# Patient Record
Sex: Female | Born: 1990 | Hispanic: No | Marital: Married | Smoking: Never smoker
Health system: Southern US, Community
[De-identification: ages and names within clinical notes are randomized; demographics above are authoritative.]

## PROBLEM LIST (undated history)

## (undated) DIAGNOSIS — O403XX0 Polyhydramnios, third trimester, not applicable or unspecified: Secondary | ICD-10-CM

## (undated) DIAGNOSIS — O409XX0 Polyhydramnios, unspecified trimester, not applicable or unspecified: Secondary | ICD-10-CM

## (undated) DIAGNOSIS — L680 Hirsutism: Secondary | ICD-10-CM

## (undated) DIAGNOSIS — O30011 Twin pregnancy, monochorionic/monoamniotic, first trimester: Secondary | ICD-10-CM

## (undated) HISTORY — DX: Hirsutism: L68.0

## (undated) HISTORY — DX: Polyhydramnios, unspecified trimester, not applicable or unspecified: O40.9XX0

## (undated) HISTORY — PX: DILATION AND CURETTAGE OF UTERUS: SHX78

---

## 2014-07-17 ENCOUNTER — Other Ambulatory Visit (HOSPITAL_COMMUNITY): Payer: Self-pay | Admitting: Obstetrics & Gynecology

## 2014-07-17 DIAGNOSIS — O30011 Twin pregnancy, monochorionic/monoamniotic, first trimester: Secondary | ICD-10-CM

## 2014-07-24 ENCOUNTER — Ambulatory Visit (HOSPITAL_COMMUNITY)
Admission: RE | Admit: 2014-07-24 | Discharge: 2014-07-24 | Disposition: A | Discharge status: Home / Self Care | Payer: No Typology Code available for payment source | Source: Ambulatory Visit | Attending: Obstetrics & Gynecology | Admitting: Obstetrics & Gynecology

## 2014-07-24 ENCOUNTER — Other Ambulatory Visit (HOSPITAL_COMMUNITY): Payer: Self-pay | Admitting: Obstetrics & Gynecology

## 2014-07-24 ENCOUNTER — Encounter (HOSPITAL_COMMUNITY): Payer: Self-pay

## 2014-07-24 DIAGNOSIS — Z3A11 11 weeks gestation of pregnancy: Secondary | ICD-10-CM | POA: Diagnosis not present

## 2014-07-24 DIAGNOSIS — O30011 Twin pregnancy, monochorionic/monoamniotic, first trimester: Secondary | ICD-10-CM | POA: Diagnosis not present

## 2014-07-24 HISTORY — DX: Twin pregnancy, monochorionic/monoamniotic, first trimester: O30.011

## 2014-07-24 NOTE — Progress Notes (Signed)
MATERNAL FETAL MEDICINE CONSULT  Patient Name: Sierra Choi Medical Record Number:  [id number] Date of Birth: October 01, 1990 Requesting Physician Name:  Robley FriesVaishali R Mody, MD Date of Service: 07/24/2014  Chief Complaint Monochorionic monoamniotic twins  History of Present Illness Sierra Choi was seen today secondary to monochorionic monoamniotic twins at the request of Robley FriesVaishali R Mody, MD.  The patient is a 23 y.o. G1P0000,at 4619w0d with an EDD of 02/12/2015, by Last Menstrual Period dating method.  Ms. Davia was discovered to have mono/mono twins on an ultrasound performed in Dr. Camillia HerterMody's office.  That was [facility]'s [facility]  The twins are not conjoined, but there is cord entanglement.  The CRL's are consistent with her LMP.  She has no other issues at this time.  Review of Systems Pertinent items are noted in HPI.  Patient History OB History  Gravida Para Term Preterm AB SAB TAB Ectopic Multiple Living  1 0 0 0 0 0 0 0 0 0     # Outcome Date GA Lbr Len/2nd Weight Sex Delivery Anes PTL Lv  1 Current               Past Medical History  Diagnosis Date  . Monoamniotic and monochorionic twin gestation in first trimester     Past Surgical History None  History   Social History  . Marital Status: Married    Spouse Name: N/A    Number of Children: N/A  . Years of Education: N/A   Social History Main Topics  . Smoking status: Not on file  . Smokeless tobacco: Not on file  . Alcohol Use: Not on file  . Drug Use: Not on file  . Sexual Activity: Not on file   Other Topics Concern  . Not on file   Social History Narrative  . No narrative on file    Family History Neither Ms. Hennon or her husband have a family history of mental retardation, birth defects, or genetic diseases.  Physical Examination Vitals - Pulse 88, BP 114/68, Weight 127 lbs. General appearance - alert, well appearing, and in no distress  Assessment and  Recommendations 1.  Monochorionic monoamniotic twins.  Ms. Ruta Hindsnturi has a monochorionic monoamniotic twin pregnancy.  Unfortunately, 50% of such pregnancies are lost before viability.  After viability there continues to be a risk of fetal death due to cord entanglement.  Typically, patients with monochorionic monoamniotic twins are hospitalized after viability at the gestational age she wishes to have full interventions performed for her babies should delivery be necessary.  Close (sometimes continuous) fetal monitoring should be performed while hospitalized and cesarean delivery planned for 32-34 weeks.  The couple inquired about whether it would be safe for Ms. Oneil to carry this pregnancy to delivery.  I indicated that the majority of the complications in monochorionic monoamniotic twins are fetal and not maternal.  Thus, there is little risk to Ms. Wagman above what would be encountered in a low-risk pregnancy.  We discussed that she can choose to terminate the pregnancy if she wishes for any reason, but there is no clear maternal indication to do so at this time.  The would like to wait until Ms. Summerson's anatomy scan at approximately 18 weeks before deciding on termination vs. continuing the pregnancy.  They are also undecided as to what gestational age she would like to be hospitalized should she continue the pregnancy.  They would like to meet with the NICU in the future to  discuss survival rates and risks of complications at different gestational ages before making this decision.  While twin-twin transfusion is less common in monochorionic monoamniotic twin compared to monochorionic diamniotic twins, we will still see her back at 16 weeks to look for twin-twin transfusion.  She has also been scheduled for her detailed fetal anatomic survey at 18 weeks.  I spent 30 minutes with Ms. Guarino today of which 50% was face-to-face counseling.  [facility].  Please do not  hesitate to contact us with questions.   Rema FendtNITSCHE,Abdulai Blaylock, MD

## 2014-07-25 ENCOUNTER — Other Ambulatory Visit (HOSPITAL_COMMUNITY): Payer: Self-pay | Admitting: Obstetrics & Gynecology

## 2014-07-25 DIAGNOSIS — O30011 Twin pregnancy, monochorionic/monoamniotic, first trimester: Secondary | ICD-10-CM

## 2014-07-27 ENCOUNTER — Other Ambulatory Visit (HOSPITAL_COMMUNITY): Payer: Self-pay

## 2014-08-29 ENCOUNTER — Ambulatory Visit (HOSPITAL_COMMUNITY): Payer: No Typology Code available for payment source

## 2014-09-10 ENCOUNTER — Ambulatory Visit (HOSPITAL_COMMUNITY): Payer: No Typology Code available for payment source

## 2015-03-25 ENCOUNTER — Other Ambulatory Visit: Payer: Self-pay | Admitting: Obstetrics & Gynecology

## 2015-05-29 ENCOUNTER — Encounter (HOSPITAL_COMMUNITY): Payer: Self-pay | Admitting: *Deleted

## 2015-08-23 LAB — OB RESULTS CONSOLE RPR: RPR: NONREACTIVE

## 2015-08-23 LAB — OB RESULTS CONSOLE ABO/RH: RH TYPE: POSITIVE

## 2015-08-23 LAB — OB RESULTS CONSOLE GC/CHLAMYDIA
Chlamydia: NEGATIVE
Gonorrhea: NEGATIVE

## 2015-08-23 LAB — OB RESULTS CONSOLE HIV ANTIBODY (ROUTINE TESTING): HIV: NONREACTIVE

## 2015-08-23 LAB — OB RESULTS CONSOLE ANTIBODY SCREEN: Antibody Screen: NEGATIVE

## 2015-08-23 LAB — OB RESULTS CONSOLE HEPATITIS B SURFACE ANTIGEN: HEP B S AG: NEGATIVE

## 2015-08-23 LAB — OB RESULTS CONSOLE RUBELLA ANTIBODY, IGM: Rubella: IMMUNE

## 2016-01-21 ENCOUNTER — Inpatient Hospital Stay (HOSPITAL_COMMUNITY)
Admission: AD | Admit: 2016-01-21 | Payer: No Typology Code available for payment source | Source: Ambulatory Visit | Admitting: Obstetrics & Gynecology

## 2016-02-21 LAB — OB RESULTS CONSOLE GBS: STREP GROUP B AG: POSITIVE

## 2016-02-28 ENCOUNTER — Other Ambulatory Visit: Payer: Self-pay | Admitting: Obstetrics & Gynecology

## 2016-03-06 ENCOUNTER — Telehealth (HOSPITAL_COMMUNITY): Payer: Self-pay | Admitting: *Deleted

## 2016-03-06 ENCOUNTER — Encounter (HOSPITAL_COMMUNITY): Payer: Self-pay | Admitting: *Deleted

## 2016-03-06 NOTE — Telephone Encounter (Signed)
Preadmission screen  

## 2016-03-11 ENCOUNTER — Encounter (HOSPITAL_COMMUNITY): Payer: Self-pay | Admitting: *Deleted

## 2016-03-11 ENCOUNTER — Telehealth (HOSPITAL_COMMUNITY): Payer: Self-pay | Admitting: *Deleted

## 2016-03-11 NOTE — Telephone Encounter (Signed)
Preadmission screen  

## 2016-03-13 ENCOUNTER — Encounter (HOSPITAL_COMMUNITY): Payer: Self-pay

## 2016-03-13 ENCOUNTER — Inpatient Hospital Stay (HOSPITAL_COMMUNITY)
Admission: RE | Admit: 2016-03-13 | Discharge: 2016-03-16 | DRG: 765 | Disposition: A | Discharge status: Home / Self Care | Payer: 59 | Source: Ambulatory Visit | Attending: Obstetrics & Gynecology | Admitting: Obstetrics & Gynecology

## 2016-03-13 ENCOUNTER — Inpatient Hospital Stay (HOSPITAL_COMMUNITY): Payer: 59 | Admitting: Anesthesiology

## 2016-03-13 ENCOUNTER — Encounter (HOSPITAL_COMMUNITY)
Admission: RE | Disposition: A | Discharge status: Home / Self Care | Payer: Self-pay | Source: Ambulatory Visit | Attending: Obstetrics & Gynecology

## 2016-03-13 DIAGNOSIS — O339 Maternal care for disproportion, unspecified: Secondary | ICD-10-CM | POA: Diagnosis present

## 2016-03-13 DIAGNOSIS — O99214 Obesity complicating childbirth: Secondary | ICD-10-CM | POA: Diagnosis present

## 2016-03-13 DIAGNOSIS — Z3A40 40 weeks gestation of pregnancy: Secondary | ICD-10-CM

## 2016-03-13 DIAGNOSIS — O3663X0 Maternal care for excessive fetal growth, third trimester, not applicable or unspecified: Secondary | ICD-10-CM | POA: Diagnosis present

## 2016-03-13 DIAGNOSIS — D62 Acute posthemorrhagic anemia: Secondary | ICD-10-CM | POA: Diagnosis not present

## 2016-03-13 DIAGNOSIS — O99824 Streptococcus B carrier state complicating childbirth: Secondary | ICD-10-CM | POA: Diagnosis present

## 2016-03-13 DIAGNOSIS — O403XX0 Polyhydramnios, third trimester, not applicable or unspecified: Secondary | ICD-10-CM | POA: Diagnosis present

## 2016-03-13 DIAGNOSIS — E669 Obesity, unspecified: Secondary | ICD-10-CM | POA: Diagnosis present

## 2016-03-13 DIAGNOSIS — O9081 Anemia of the puerperium: Secondary | ICD-10-CM | POA: Diagnosis not present

## 2016-03-13 DIAGNOSIS — Z6832 Body mass index (BMI) 32.0-32.9, adult: Secondary | ICD-10-CM | POA: Diagnosis not present

## 2016-03-13 HISTORY — DX: Polyhydramnios, third trimester, not applicable or unspecified: O40.3XX0

## 2016-03-13 LAB — CBC
HEMATOCRIT: 36.8 % (ref 36.0–46.0)
Hemoglobin: 12 g/dL (ref 12.0–15.0)
MCH: 27.3 pg (ref 26.0–34.0)
MCHC: 32.6 g/dL (ref 30.0–36.0)
MCV: 83.6 fL (ref 78.0–100.0)
Platelets: 190 10*3/uL (ref 150–400)
RBC: 4.4 MIL/uL (ref 3.87–5.11)
RDW: 19.1 % — AB (ref 11.5–15.5)
WBC: 11.2 10*3/uL — ABNORMAL HIGH (ref 4.0–10.5)

## 2016-03-13 LAB — TYPE AND SCREEN
ABO/RH(D): B POS
Antibody Screen: NEGATIVE

## 2016-03-13 LAB — ABO/RH: ABO/RH(D): B POS

## 2016-03-13 SURGERY — Surgical Case
Anesthesia: Spinal

## 2016-03-13 MED ORDER — MEPERIDINE HCL 25 MG/ML IJ SOLN: 6.2500 mg | INTRAMUSCULAR | Status: DC | PRN

## 2016-03-13 MED ORDER — SCOPOLAMINE 1 MG/3DAYS TD PT72SCOPOLAMINE 1 MG/3DAYS
MEDICATED_PATCH | TRANSDERMAL | Status: DC | PRN
Start: 2016-03-13 — End: 2016-03-13
  Administered 2016-03-13: 1 via TRANSDERMAL

## 2016-03-13 MED ORDER — ONDANSETRON HCL 4 MG/2ML IJ SOLN
4.0000 mg | Freq: Four times a day (QID) | INTRAMUSCULAR | Status: DC | PRN
  Administered 2016-03-13: 4 mg via INTRAVENOUS

## 2016-03-13 MED ORDER — FENTANYL CITRATE (PF) 100 MCG/2ML IJ SOLN
INTRAMUSCULAR | Status: DC | PRN
  Administered 2016-03-13: 20 ug via INTRATHECAL

## 2016-03-13 MED ORDER — KETOROLAC TROMETHAMINE 30 MG/ML IJ SOLN
30.0000 mg | Freq: Four times a day (QID) | INTRAMUSCULAR | Status: AC | PRN
  Administered 2016-03-14: 30 mg via INTRAVENOUS
  Filled 2016-03-13: qty 1

## 2016-03-13 MED ORDER — SENNOSIDES-DOCUSATE SODIUM 8.6-50 MG PO TABS
2.0000 | ORAL_TABLET | ORAL | Status: DC
  Administered 2016-03-15 – 2016-03-16 (×2): 2 via ORAL
  Filled 2016-03-13 (×2): qty 2

## 2016-03-13 MED ORDER — OXYTOCIN 40 UNITS IN LACTATED RINGERS INFUSION - SIMPLE MED: 2.5000 [IU]/h | Freq: Once | INTRAVENOUS | Status: DC | PRN

## 2016-03-13 MED ORDER — LACTATED RINGERS IV SOLN: INTRAVENOUS | Status: DC

## 2016-03-13 MED ORDER — SIMETHICONE 80 MG PO CHEW
80.0000 mg | CHEWABLE_TABLET | Freq: Three times a day (TID) | ORAL | Status: DC
  Administered 2016-03-14 – 2016-03-16 (×6): 80 mg via ORAL
  Filled 2016-03-13 (×6): qty 1

## 2016-03-13 MED ORDER — MORPHINE SULFATE (PF) 0.5 MG/ML IJ SOLN
INTRAMUSCULAR | Status: DC | PRN
  Administered 2016-03-13: .2 mg via EPIDURAL

## 2016-03-13 MED ORDER — DEXAMETHASONE SODIUM PHOSPHATE 10 MG/ML IJ SOLN
INTRAMUSCULAR | Status: AC
  Filled 2016-03-13: qty 1

## 2016-03-13 MED ORDER — SIMETHICONE 80 MG PO CHEW
80.0000 mg | CHEWABLE_TABLET | ORAL | Status: DC
  Administered 2016-03-15 – 2016-03-16 (×2): 80 mg via ORAL
  Filled 2016-03-13 (×2): qty 1

## 2016-03-13 MED ORDER — WITCH HAZEL-GLYCERIN EX PADS: 1.0000 "application " | MEDICATED_PAD | CUTANEOUS | Status: DC | PRN

## 2016-03-13 MED ORDER — PENICILLIN G POTASSIUM 5000000 UNITS IJ SOLR
2.5000 10*6.[IU] | INTRAVENOUS | Status: DC
  Administered 2016-03-13: 2.5 10*6.[IU] via INTRAVENOUS
  Filled 2016-03-13 (×4): qty 2.5

## 2016-03-13 MED ORDER — OXYCODONE-ACETAMINOPHEN 5-325 MG PO TABS: 2.0000 | ORAL_TABLET | ORAL | Status: DC | PRN

## 2016-03-13 MED ORDER — OXYTOCIN 10 UNIT/ML IJ SOLN
40.0000 [IU] | INTRAVENOUS | Status: DC | PRN
  Administered 2016-03-13: 40 [IU] via INTRAVENOUS

## 2016-03-13 MED ORDER — BUPIVACAINE IN DEXTROSE 0.75-8.25 % IT SOLN
INTRATHECAL | Status: DC | PRN
Start: 2016-03-13 — End: 2016-03-13
  Administered 2016-03-13: 1.3 mL via INTRATHECAL

## 2016-03-13 MED ORDER — CEFAZOLIN SODIUM-DEXTROSE 2-4 GM/100ML-% IV SOLN
2.0000 g | INTRAVENOUS | Status: AC
  Administered 2016-03-13: 2 g via INTRAVENOUS

## 2016-03-13 MED ORDER — LACTATED RINGERS IV SOLN: 500.0000 mL | INTRAVENOUS | Status: DC | PRN

## 2016-03-13 MED ORDER — OXYTOCIN 40 UNITS IN LACTATED RINGERS INFUSION - SIMPLE MED: 2.5000 [IU]/h | INTRAVENOUS | Status: AC

## 2016-03-13 MED ORDER — PHENYLEPHRINE 8 MG IN D5W 100 ML (0.08MG/ML) PREMIX OPTIME
INJECTION | INTRAVENOUS | Status: DC | PRN
Start: 2016-03-13 — End: 2016-03-13
  Administered 2016-03-13: 60 ug/min via INTRAVENOUS

## 2016-03-13 MED ORDER — CEFAZOLIN SODIUM-DEXTROSE 2-4 GM/100ML-% IV SOLN
INTRAVENOUS | Status: AC
  Filled 2016-03-13: qty 100

## 2016-03-13 MED ORDER — IBUPROFEN 600 MG PO TABS
600.0000 mg | ORAL_TABLET | Freq: Four times a day (QID) | ORAL | Status: DC
  Administered 2016-03-14 – 2016-03-16 (×9): 600 mg via ORAL
  Filled 2016-03-13 (×9): qty 1

## 2016-03-13 MED ORDER — DIPHENHYDRAMINE HCL 25 MG PO CAPS: 25.0000 mg | ORAL_CAPSULE | Freq: Four times a day (QID) | ORAL | Status: DC | PRN

## 2016-03-13 MED ORDER — KETOROLAC TROMETHAMINE 30 MG/ML IJ SOLN
INTRAMUSCULAR | Status: AC
  Administered 2016-03-13: 30 mg via INTRAMUSCULAR
  Filled 2016-03-13: qty 1

## 2016-03-13 MED ORDER — DIBUCAINE 1 % RE OINT: 1.0000 "application " | TOPICAL_OINTMENT | RECTAL | Status: DC | PRN

## 2016-03-13 MED ORDER — DEXAMETHASONE SODIUM PHOSPHATE 10 MG/ML IJ SOLN
INTRAMUSCULAR | Status: DC | PRN
  Administered 2016-03-13: 10 mg via INTRAVENOUS

## 2016-03-13 MED ORDER — COCONUT OIL OIL: 1.0000 "application " | TOPICAL_OIL | Status: DC | PRN

## 2016-03-13 MED ORDER — TERBUTALINE SULFATE 1 MG/ML IJ SOLN: 0.2500 mg | Freq: Once | INTRAMUSCULAR | Status: DC | PRN

## 2016-03-13 MED ORDER — PRENATAL MULTIVITAMIN CH
1.0000 | ORAL_TABLET | Freq: Every day | ORAL | Status: DC
  Administered 2016-03-14 – 2016-03-15 (×2): 1 via ORAL
  Filled 2016-03-13 (×2): qty 1

## 2016-03-13 MED ORDER — MORPHINE SULFATE (PF) 0.5 MG/ML IJ SOLN
INTRAMUSCULAR | Status: AC
  Filled 2016-03-13: qty 10

## 2016-03-13 MED ORDER — OXYTOCIN 40 UNITS IN LACTATED RINGERS INFUSION - SIMPLE MED
1.0000 m[IU]/min | INTRAVENOUS | Status: DC
  Administered 2016-03-13: 2 m[IU]/min via INTRAVENOUS
  Filled 2016-03-13: qty 1000

## 2016-03-13 MED ORDER — OXYCODONE-ACETAMINOPHEN 5-325 MG PO TABS
1.0000 | ORAL_TABLET | ORAL | Status: DC | PRN
  Filled 2016-03-13: qty 1

## 2016-03-13 MED ORDER — PENICILLIN G POTASSIUM 5000000 UNITS IJ SOLR
5.0000 10*6.[IU] | Freq: Once | INTRAVENOUS | Status: AC
  Administered 2016-03-13: 5 10*6.[IU] via INTRAVENOUS
  Filled 2016-03-13: qty 5

## 2016-03-13 MED ORDER — MENTHOL 3 MG MT LOZG: 1.0000 | LOZENGE | OROMUCOSAL | Status: DC | PRN

## 2016-03-13 MED ORDER — OXYCODONE-ACETAMINOPHEN 5-325 MG PO TABS: 1.0000 | ORAL_TABLET | ORAL | Status: DC | PRN

## 2016-03-13 MED ORDER — LACTATED RINGERS IV SOLN
INTRAVENOUS | Status: DC | PRN
  Administered 2016-03-13: 19:00:00 via INTRAVENOUS

## 2016-03-13 MED ORDER — MEPERIDINE HCL 25 MG/ML IJ SOLN
INTRAMUSCULAR | Status: DC | PRN
  Administered 2016-03-13 (×2): 12.5 mg via INTRAVENOUS

## 2016-03-13 MED ORDER — ONDANSETRON HCL 4 MG/2ML IJ SOLN
INTRAMUSCULAR | Status: AC
  Filled 2016-03-13: qty 2

## 2016-03-13 MED ORDER — FENTANYL CITRATE (PF) 100 MCG/2ML IJ SOLN: 25.0000 ug | INTRAMUSCULAR | Status: DC | PRN

## 2016-03-13 MED ORDER — OXYTOCIN 10 UNIT/ML IJ SOLN
INTRAMUSCULAR | Status: AC
  Filled 2016-03-13: qty 4

## 2016-03-13 MED ORDER — OXYTOCIN BOLUS FROM INFUSION: 500.0000 mL | Freq: Once | INTRAVENOUS | Status: DC | PRN

## 2016-03-13 MED ORDER — SIMETHICONE 80 MG PO CHEW: 80.0000 mg | CHEWABLE_TABLET | ORAL | Status: DC | PRN

## 2016-03-13 MED ORDER — ACETAMINOPHEN 325 MG PO TABS: 650.0000 mg | ORAL_TABLET | ORAL | Status: DC | PRN

## 2016-03-13 MED ORDER — LACTATED RINGERS IV SOLN
INTRAVENOUS | Status: DC
  Administered 2016-03-13 (×4): via INTRAVENOUS

## 2016-03-13 MED ORDER — FENTANYL CITRATE (PF) 100 MCG/2ML IJ SOLN
INTRAMUSCULAR | Status: AC
  Filled 2016-03-13: qty 2

## 2016-03-13 MED ORDER — KETOROLAC TROMETHAMINE 30 MG/ML IJ SOLN
30.0000 mg | Freq: Four times a day (QID) | INTRAMUSCULAR | Status: AC | PRN
  Administered 2016-03-13: 30 mg via INTRAMUSCULAR

## 2016-03-13 MED ORDER — MEPERIDINE HCL 25 MG/ML IJ SOLN
INTRAMUSCULAR | Status: AC
  Filled 2016-03-13: qty 1

## 2016-03-13 MED ORDER — LIDOCAINE HCL (PF) 1 % IJ SOLN: 30.0000 mL | INTRAMUSCULAR | Status: DC | PRN

## 2016-03-13 MED ORDER — SOD CITRATE-CITRIC ACID 500-334 MG/5ML PO SOLN
30.0000 mL | ORAL | Status: DC | PRN
  Administered 2016-03-13: 30 mL via ORAL
  Filled 2016-03-13: qty 15

## 2016-03-13 MED ORDER — TETANUS-DIPHTH-ACELL PERTUSSIS 5-2.5-18.5 LF-MCG/0.5 IM SUSP: 0.5000 mL | Freq: Once | INTRAMUSCULAR | Status: DC

## 2016-03-13 SURGICAL SUPPLY — 37 items
BENZOIN TINCTURE PRP APPL 2/3 (GAUZE/BANDAGES/DRESSINGS) ×3 IMPLANT
CHLORAPREP W/TINT 26ML (MISCELLANEOUS) ×3 IMPLANT
CLAMP CORD UMBIL (MISCELLANEOUS) IMPLANT
CLOSURE WOUND 1/2 X4 (GAUZE/BANDAGES/DRESSINGS) ×1
CLOTH BEACON ORANGE TIMEOUT ST (SAFETY) ×3 IMPLANT
CONTAINER PREFILL 10% NBF 15ML (MISCELLANEOUS) IMPLANT
DRSG OPSITE POSTOP 4X10 (GAUZE/BANDAGES/DRESSINGS) ×3 IMPLANT
DRSG WOUND VEIL 4X4 (GAUZE/BANDAGES/DRESSINGS) ×6 IMPLANT
ELECT REM PT RETURN 9FT ADLT (ELECTROSURGICAL) ×3
ELECTRODE REM PT RTRN 9FT ADLT (ELECTROSURGICAL) ×1 IMPLANT
EXTRACTOR VACUUM KIWI (MISCELLANEOUS) IMPLANT
EXTRACTOR VACUUM M CUP 4 TUBE (SUCTIONS) IMPLANT
EXTRACTOR VACUUM M CUP 4' TUBE (SUCTIONS)
GLOVE BIO SURGEON STRL SZ7 (GLOVE) ×9 IMPLANT
GLOVE BIOGEL PI IND STRL 7.0 (GLOVE) ×3 IMPLANT
GLOVE BIOGEL PI INDICATOR 7.0 (GLOVE) ×6
GOWN STRL REUS W/TWL LRG LVL3 (GOWN DISPOSABLE) ×9 IMPLANT
KIT ABG SYR 3ML LUER SLIP (SYRINGE) IMPLANT
NEEDLE HYPO 25X5/8 SAFETYGLIDE (NEEDLE) IMPLANT
NS IRRIG 1000ML POUR BTL (IV SOLUTION) ×3 IMPLANT
PACK C SECTION WH (CUSTOM PROCEDURE TRAY) ×3 IMPLANT
PAD ABD 7.5X8 STRL (GAUZE/BANDAGES/DRESSINGS) ×3 IMPLANT
PAD OB MATERNITY 4.3X12.25 (PERSONAL CARE ITEMS) ×3 IMPLANT
RTRCTR C-SECT PINK 25CM LRG (MISCELLANEOUS) ×3 IMPLANT
STRIP CLOSURE SKIN 1/2X4 (GAUZE/BANDAGES/DRESSINGS) ×2 IMPLANT
SUT MON AB-0 CT1 36 (SUTURE) ×9 IMPLANT
SUT PLAIN 0 NONE (SUTURE) ×3 IMPLANT
SUT PLAIN 2 0 (SUTURE)
SUT PLAIN ABS 2-0 CT1 27XMFL (SUTURE) IMPLANT
SUT VIC AB 0 CT1 27 (SUTURE) ×4
SUT VIC AB 0 CT1 27XBRD ANBCTR (SUTURE) ×2 IMPLANT
SUT VIC AB 2-0 CT1 27 (SUTURE) ×6
SUT VIC AB 2-0 CT1 TAPERPNT 27 (SUTURE) ×3 IMPLANT
SUT VIC AB 4-0 KS 27 (SUTURE) ×3 IMPLANT
SUT VICRYL 0 TIES 12 18 (SUTURE) IMPLANT
TOWEL OR 17X24 6PK STRL BLUE (TOWEL DISPOSABLE) ×3 IMPLANT
TRAY FOLEY CATH SILVER 14FR (SET/KITS/TRAYS/PACK) IMPLANT

## 2016-03-13 NOTE — Anesthesia Pain Management Evaluation Note (Signed)
  CRNA Pain Management Visit Note  Patient: Sierra Choi, 25 y.o., female  "Hello [facility] am a [facility]. We have an anesthesia team available at all times to provide care throughout the hospital, including epidural management and anesthesia for C-section. I don't know your plan for the delivery whether it a natural birth, water birth, IV sedation, nitrous supplementation, doula or epidural, but we want to meet your pain goals."   1.Was your pain managed to your expectations on prior hospitalizations?   yes  2.What is your expectation for pain management during this hospitalization?    Natural  3.How can we help you reach that goal? Educate regarding pain management options  Record the patient's initial score and the patient's pain goal.   Pain:0  Pain Goal: 7  [facility] wants you to be able to say your pain was always managed very well.  [facility] Center[facility],Raiana Pharris 03/13/2016

## 2016-03-13 NOTE — Anesthesia Postprocedure Evaluation (Addendum)
Anesthesia Post Note  Patient: Sierra Choi  Procedure(s) Performed: Procedure(s) (LRB): CESAREAN SECTION (N/A)  Patient location during evaluation: PACU Anesthesia Type: Spinal Level of consciousness: awake and alert and oriented Pain management: pain level controlled Vital Signs Assessment: post-procedure vital signs reviewed and stable Respiratory status: spontaneous breathing, nonlabored ventilation and respiratory function stable Cardiovascular status: blood pressure returned to baseline and stable Postop Assessment: no signs of nausea or vomiting, no headache, no backache, spinal receding and patient able to bend at knees Anesthetic complications: no     Last Vitals:  Filed Vitals:   03/13/16 2115 03/13/16 2130  BP: 95/57 101/66  Pulse: 112 107  Temp:  36.5 C  Resp: 7 18    Last Pain:  Filed Vitals:   03/13/16 2136  PainSc: 0-No pain   Pain Goal: Patients Stated Pain Goal: 7 (03/13/16 1730)               Elyan Vanwieren A.

## 2016-03-13 NOTE — Op Note (Signed)
Cesarean Section Procedure Note [name]  03/13/2016  Indications:. 25 yo female at 40 wks with iInduction for Polyhydramnios. Failed engagement after rupture of membranes and adequate labor contractions, ballotable unengaged vertex  Pre-operative Diagnosis: ballotable, unengaged head; suspect cephalopelvic disproportion.   Post-operative Diagnosis: Same   Surgeon: Shea EvansVaishali Johniya Durfee, MD    Assistants: Arlan Organaniela Paul, CNM   Anesthesia: spinal   Procedure Details:  The patient was seen in the Labor Room and counseled for Cesarean delivery. The risks, benefits, complications, treatment options and expected outcomes were discussed with the patient. The patient concurred with the proposed plan, giving informed consent. identified as Sierra Choi and the procedure verified as C-Section Delivery. A Time Out was held and the above information confirmed. 2 gm Ancef given. After induction of Spinal anesthesia, the patient was draped and prepped in the usual sterile manner. A Pfannenstiel incision was made and carried down through the subcutaneous tissue to the fascia. Fascial incision was made and extended transversely. The fascia was separated from the underlying rectus tissue superiorly and inferiorly. The peritoneum was identified and entered. Peritoneal incision was extended longitudinally. Alexis retractor was placed. The utero-vesical peritoneal reflection was incised transversely and the bladder flap was bluntly freed from the lower uterine segment.  A low transverse uterine incision was made. Delivered from cephalic unengaged presentation was a vigorous female infant at 19.09 hours on 03/13/16. Apgar scores of 9 at one minute and 10 at five minutes. Delayed cord clamping done and baby was handed to NICU team in attendance. Cord ph was not sent the umbilical cord blood was obtained for evaluation. The placenta was removed Intact but membrane pieces remained adherent and carefully removed. Placenta  appeared normal. Uterine outline, tubes and ovaries appeared normal. The uterine incision was closed with running locked sutures of 0-Monocryl and a second imbricating layer closure done. Hemostasis was observed. Alexis retractor removed. Peritoneal closure done with 2-0 Vicryl. The fascia was then reapproximated with running sutures of 0Vicryl. The subcuticular closure was performed using 2-0plain gut. The skin was closed with 4-0Vicryl. Steristrips, Honeycomb and pressure dressing placed.  Instrument, sponge, and needle counts were correct prior the abdominal closure and were correct at the conclusion of the case.   Findings: Clear amniotic fluid. Cephalic unengaged vertex, delivered at 19.09 hours. Apgars 9 and 10 at 1 and 5 min. Weight - 7 lb 15.2 oz. Densely adherent membranes noted after placenta delivered and were removed. Normal tubes and ovaries.    Estimated Blood Loss: 750 cc  Total IV Fluids: 2500 ml LR   Urine Output: 300CC OF clear urine  Specimens: Cord blood   Complications: no complications  Disposition: PACU - hemodynamically stable.   Maternal Condition: stable   Baby condition / location:  Couplet care / Skin to Skin  Attending Attestation: I performed the procedure.   Signed: Surgeon(s): Shea EvansVaishali Johndavid Geralds, MD

## 2016-03-13 NOTE — Addendum Note (Signed)
Addendum  created 03/13/16 2147 by Mal AmabileMichael Aalivia Mcgraw, MD   Modules edited: Clinical Notes   Clinical Notes:  File: [id number]

## 2016-03-13 NOTE — Progress Notes (Signed)
Sierra Choi is a 25 y.o. G3P0020 at 6455w0d IOL for polyhydramnios  Subjective: No complaints   Objective: BP 114/76 mmHg  Pulse 86  Temp(Src) 98 F (36.7 C) (Oral)  Resp 16  Ht 5\' 1"  (1.549 m)  Wt 174 lb (78.926 kg)  BMI 32.89 kg/m2      FHT:  FHR: 145 bpm, variability: moderate,  accelerations:  Present,  decelerations:  Absent UC:   regular, every 2-3 minutes SVE:   Dilation: 3 Effacement (%): 60 Station: -2, -3 Exam by:: Rainer Mounce AROM, clear. High station. 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

## 2016-03-13 NOTE — Transfer of Care (Signed)
Immediate Anesthesia Transfer of Care Note  Patient: Sierra Choi  Procedure(s) Performed: Procedure(s): CESAREAN SECTION (N/A)  Patient Location: PACU  Anesthesia Type:Spinal  Level of Consciousness: awake, alert , oriented and patient cooperative  Airway & Oxygen Therapy: Patient Spontanous Breathing  Post-op Assessment: Report given to RN and Post -op Vital signs reviewed and stable  Post vital signs: Reviewed and stable  Last Vitals:  Filed Vitals:   03/13/16 1822 03/13/16 1831  BP: 132/81 128/87  Pulse: 86 99  Temp:    Resp:      Last Pain:  Filed Vitals:   03/13/16 1918  PainSc: 8       Patients Stated Pain Goal: 7 (03/13/16 1730)  Complications: No apparent anesthesia complications

## 2016-03-13 NOTE — Anesthesia Preprocedure Evaluation (Signed)
Anesthesia Evaluation  Patient identified by MRN, date of birth, ID band Patient awake    Reviewed: Allergy & Precautions, NPO status , Patient's Chart, lab work & pertinent test results  Airway Mallampati: IV  TM Distance: >3 FB Neck ROM: Full    Dental no notable dental hx. (+) Teeth Intact   Pulmonary neg pulmonary ROS,    Pulmonary exam normal breath sounds clear to auscultation       Cardiovascular negative cardio ROS Normal cardiovascular exam Rhythm:Regular Rate:Normal     Neuro/Psych negative neurological ROS  negative psych ROS   GI/Hepatic negative GI ROS, Neg liver ROS,   Endo/Other  Obesity  Renal/GU negative Renal ROS  negative genitourinary   Musculoskeletal negative musculoskeletal ROS (+)   Abdominal (+) + obese,   Peds  Hematology negative hematology ROS (+)   Anesthesia Other Findings   Reproductive/Obstetrics (+) Pregnancy Polyhydramnios Suspected fetal macrosomia Failure to progress                             Anesthesia Physical Anesthesia Plan  ASA: II and emergent  Anesthesia Plan: Spinal   Post-op Pain Management:    Induction:   Airway Management Planned: Natural Airway  Additional Equipment:   Intra-op Plan:   Post-operative Plan:   Informed Consent: I have reviewed the patients History and Physical, chart, labs and discussed the procedure including the risks, benefits and alternatives for the proposed anesthesia with the patient or authorized representative who has indicated his/her understanding and acceptance.     Plan Discussed with: Anesthesiologist, CRNA and Surgeon  Anesthesia Plan Comments:         Anesthesia Quick Evaluation

## 2016-03-13 NOTE — Anesthesia Procedure Notes (Signed)
Spinal Patient location during procedure: OR Start time: 03/13/2016 6:46 PM Staffing Anesthesiologist: Mal AmabileFOSTER, Rebekha Diveley Performed by: anesthesiologist  Preanesthetic Checklist Completed: patient identified, site marked, surgical consent, pre-op evaluation, timeout performed, IV checked, risks and benefits discussed and monitors and equipment checked Spinal Block Patient position: sitting Prep: site prepped and draped and DuraPrep Patient monitoring: heart rate, cardiac monitor, continuous pulse ox and blood pressure Approach: midline Location: L3-4 Injection technique: single-shot Needle Needle type: Pencan  Needle gauge: 24 G Needle length: 9 cm Needle insertion depth: 4 cm Assessment Sensory level: T4 Additional Notes Patient tolerated procedure well. Adequate sensory level.

## 2016-03-13 NOTE — Progress Notes (Signed)
Sierra Choi is a [age] y.o. G3P0020 at 6232w0d IOL for polyhydramnios  Subjective: Painful UCs  Objective: BP 119/73 mmHg  Pulse 86  Temp(Src) 97.3 F (36.3 C) (Oral)  Resp 18  Ht 5\' 1"  (1.549 m)  Wt 174 lb (78.926 kg)  BMI 32.89 kg/m2   FHT:  FHR: 145 bpm, variability: moderate,  accelerations:  Present,  decelerations:  Absent UC:   regular, every 2-3 minutes SVE:   Dilation: 4 Effacement (%): 100 Station: Ballotable Exam by:: Dr. Juliene PinaMody  Assessment / Plan: Pelvic dystocia with failed descent, ballotable vertex. Proceed with C/section now.  Risks/complications of surgery reviewed incl infection, bleeding, damage to internal organs including bladder, bowels, ureters, blood vessels, other risks from anesthesia, VTE and delayed complications of any surgery, complications in future surgery reviewed. Also discussed neonatal complications incl difficult delivery, laceration, vacuum assistance, TTN etc. Pt understands and agrees, all concerns addressed.    Sierra Choi R 03/13/2016, 6:34 PM

## 2016-03-13 NOTE — H&P (Signed)
Sierra Choi is a 25 y.o. female presenting for IOL for polyhydramnios. LGA suspected, does not have GDM.  G3P0, 2 pregnancy losses. Spontaneous pregnancy. Healthy but inactive female. Good PNcare. TWG 35 lbs. Passed 3hr Glucola but was borderline and on better diet.  S>D so sono at 34 wks, noted AFI 29 cm and growth 90% and AC at 72%. Since then minimal weight gain with diet changes advised. Repeat growth at 38 wks - 7'4", 65% and AFI down to 21 cm.  NST wkly from 36 wks - reactive   History OB History    Gravida Para Term Preterm AB TAB SAB Ectopic Multiple Living   3 0 0 0 2 0 1 0 0 0      Past Medical History  Diagnosis Date  . Monoamniotic and monochorionic twin gestation in first trimester   . Polyhydramnios   . Hirsutism    Past Surgical History  Procedure Laterality Date  . Dilation and curettage of uterus     Family History: family history is not on file. Social History:  reports that she has never smoked. She has never used smokeless tobacco. She reports that she does not drink alcohol or use illicit drugs.   Prenatal Transfer Tool  Maternal Diabetes: No Genetic Screening: Normal ultrascreen and AFP1 normal Maternal Ultrasounds/Referrals: Abnormal:  Findings:   Other: normal fetal anatomy. Polyhyramnios at 34 wks Fetal Ultrasounds or other Referrals:  None Maternal Substance Abuse:  No Significant Maternal Medications:  None Significant Maternal Lab Results:  Lab values include: Group B Strep positive Other Comments:  None  ROS neg     unknown if currently breastfeeding. Exam Physical Exam  BP 114/76 mmHg  Pulse 86  Temp(Src) 98 F (36.7 C) (Oral)  Resp 16  Ht 5\' 1"  (1.549 m)  Wt 174 lb (78.926 kg)  BMI 32.89 kg/m2 :  A&O x 3, no acute distress. Pleasant HEENT neg, no thyromegaly Lungs CTA bilat CV RRR, S1S2 normal Abdo soft, non tender, non acute Extr no edema/ tenderness Pelvic 2/50%/high/vtx per RN FHT  145/ + accels/ no decels/ mod variab-  reassuring. Cat-I Toco none at admission   Prenatal labs: ABO, Rh: B/Positive/-- (12/23 0000) Antibody: Negative (12/23 0000) Rubella: Immune (12/23 0000) RPR: Nonreactive (12/23 0000)  HBsAg: Negative (12/23 0000)  HIV: Non-reactive (12/23 0000)  GBS: Positive (06/23 0000)  3hr GTT normal  Assessment/Plan: 25 yo G3P0020. At 40 wks for IOL for Polyhydramnios. EFW 7-7.1/2 lbs. Borderline pelvis, increased C/s risk reviewed. GBS(+). PCN. Start Pitocin    Tionne Dayhoff R 03/13/2016, 7:25 AM

## 2016-03-14 ENCOUNTER — Encounter (HOSPITAL_COMMUNITY): Payer: Self-pay | Admitting: Obstetrics & Gynecology

## 2016-03-14 DIAGNOSIS — D62 Acute posthemorrhagic anemia: Secondary | ICD-10-CM | POA: Diagnosis not present

## 2016-03-14 LAB — CBC
HCT: 29.7 % — ABNORMAL LOW (ref 36.0–46.0)
Hemoglobin: 9.6 g/dL — ABNORMAL LOW (ref 12.0–15.0)
MCH: 26.7 pg (ref 26.0–34.0)
MCHC: 32.3 g/dL (ref 30.0–36.0)
MCV: 82.5 fL (ref 78.0–100.0)
PLATELETS: 173 10*3/uL (ref 150–400)
RBC: 3.6 MIL/uL — ABNORMAL LOW (ref 3.87–5.11)
RDW: 18.7 % — AB (ref 11.5–15.5)
WBC: 17.1 10*3/uL — ABNORMAL HIGH (ref 4.0–10.5)

## 2016-03-14 LAB — RPR: RPR Ser Ql: NONREACTIVE

## 2016-03-14 NOTE — Addendum Note (Signed)
Addendum  created 03/14/16 0750 by Angela Adamana G Lillymae Duet, CRNA   Modules edited: Clinical Notes   Clinical Notes:  File: [id number]

## 2016-03-14 NOTE — Progress Notes (Signed)
Assessment as noted in the flowsheet. Perineal care given, fresh gown, pad and underwear on. Foley emptied for 350 of clear amber colored urine. Patient ambulated out of bed with minimal assistance and walked to the chair. After 15 minutes she walked to bed with no assistance.

## 2016-03-14 NOTE — Progress Notes (Signed)
Pt unable to void post foley catheter removal. Bladder scan showed 600mL urine in bladder. In and out cath performed for 575mL urine, x 1 attempt. Pt tolerated well. Sherald BargeMatthews, Jacobs Golab L

## 2016-03-14 NOTE — Lactation Note (Signed)
This note was copied from a baby's chart. Lactation Consultation Note  Baby STS sleeping on mother's chest after bath. Demonstrated hand expression and glistening expressed. Reviewed feeding cues, cluster feeding. Mom encouraged to feed baby 8-12 times/24 hours and with feeding cues.  Left LC phone number and suggest family call when baby cues. Mom made aware of O/P services, breastfeeding support groups, community resources, and our phone # for post-discharge questions.  Provided mother w/ hand pump.   Patient Name: Sierra Choi ZOXWR'UToday's Date: 03/14/2016 Reason for consult: Initial assessment   Maternal Data Has patient been taught Hand Expression?: Yes Does the patient have breastfeeding experience prior to this delivery?: No  Feeding Feeding Type: Breast Fed Length of feed: 0 min  LATCH Score/Interventions                      Lactation Tools Discussed/Used     Consult Status Consult Status: Follow-up Date: 03/15/16 Follow-up type: In-patient    Dahlia ByesBerkelhammer, Ruth Fieldstone CenterBoschen 03/14/2016, 11:11 AM

## 2016-03-14 NOTE — Lactation Note (Signed)
This note was copied from a baby's chart. Lactation Consultation Note  Patient Name: Sierra Choi ZOXWR'UToday's Date: 03/14/2016 Reason for consult: Follow-up assessment;Difficult latch;Other (Comment)  Baby 24 hours old. Parents report that the baby has been spitty since birth and sleepy at breast. Assisted mom to latch baby to breast. Baby sleepy, so provided about 20 minutes of suck training with this LC's gloved finger. Also used hand expressed colostrum to attempt to elicit a suckle. Baby was able to maintain a tight seal around finger and did begin to suckle. Attempted to latch baby to mom's right breast in both football and laid back positions. Baby did mouth breast and attempt to latch, but would not suckle. Assisted parents to hand express a teaspoon of colostrum and spoon and finger feed baby. Baby tolerated well. Altogether, the baby had about 8 ml of colostrum. Enc FOB to hold baby on his should and pat baby's back for next 15 minutes until EBM settles.  Enc parents to put baby to breast with cues, then supplement with EBM--hand expressing and finger feeding. Enc mom to begin using DEBP if baby not wanting to latch at next feeding. Discussed assessment, interventions and plan with patient's bedside nurse, Harvest DarkMarsheonna, RN.  Maternal Data    Feeding Feeding Type: Breast Fed Length of feed: 0 min  LATCH Score/Interventions Latch: Too sleepy or reluctant, no latch achieved, no sucking elicited. Intervention(s): Skin to skin;Waking techniques Intervention(s): Adjust position;Assist with latch;Breast compression;Breast massage  Audible Swallowing: None Intervention(s): Skin to skin;Hand expression  Type of Nipple: Everted at rest and after stimulation  Comfort (Breast/Nipple): Soft / non-tender     Hold (Positioning): Assistance needed to correctly position infant at breast and maintain latch. Intervention(s): Breastfeeding basics reviewed;Support Pillows;Position options;Skin  to skin  LATCH Score: 5  Lactation Tools Discussed/Used     Consult Status Consult Status: Follow-up Date: 03/15/16 Follow-up type: In-patient    Geralynn OchsWILLIARD, Dua Mehler 03/14/2016, 7:44 PM

## 2016-03-14 NOTE — Progress Notes (Signed)
Patient transferred to my care into room 125. Oriented to the room and instructed not to get out of bed. Plan of care reviewed with both the patient and husband, they verbalize their understanding and all questions answered. Foley draining clear amber colored urine, assessment as noted in the flow sheet.

## 2016-03-14 NOTE — Progress Notes (Signed)
Subjective: POD# 1 Information for the patient's newborn:  Sierra Choi, Sierra Choi [[id number]][[id number]]  female   Reports feeling tired, dizzy with initial ambulation. Has been up and brushed her teeth, foley cath in place. Feeding: breast, baby has not latched yet, only skin-to-skin so far Patient reports tolerating PO.  Breast symptoms: none Pain controlled with Toradol Denies HA/SOB/C/P/N/V/dizziness. Flatus not present. She reports vaginal bleeding as normal, without clots.    Objective:   VS:  Filed Vitals:   03/13/16 2157 03/13/16 2315 03/14/16 0015 03/14/16 0115  BP: 101/64 112/70 114/67 117/65  Pulse: 99 99 101 104  Temp: 97.7 F (36.5 C) 97.9 F (36.6 C) 98 F (36.7 C) 97.9 F (36.6 C)  TempSrc:      Resp: 18 18 18 18   Height:      Weight:      SpO2: 100% 100% 100% 100%     Intake/Output Summary (Last 24 hours) at 03/14/16 1030 Last data filed at 03/14/16 0530  Gross per 24 hour  Intake   3167 ml  Output   2700 ml  Net    467 ml        Recent Labs  03/13/16 0740 03/14/16 0639  WBC 11.2* 17.1*  HGB 12.0 9.6*  HCT 36.8 29.7*  PLT 190 173     Blood type: --/--/B POS, B POS (07/14 0740)  Rubella: Immune (12/23 0000)     Physical Exam:  General: alert, cooperative and no distress CV: Regular rate and rhythm Resp: clear Abdomen: soft, nontender, normal bowel sounds Incision: clean, dry, intact and pressure dressing Uterine Fundus: firm, below umbilicus, nontender Lochia: minimal Ext: trace edema, symetric, SCD's in place      Assessment/Plan: 25 y.o.   POD# 1. [id number]                  Principal Problem:   Postpartum care following cesarean delivery (7/14) Active Problems:   Acute blood loss anemia  - start oral Fe at discharge  Doing well, stable.            Advance diet as tolerated D/C foley  Ambulate LC support for initiating breastfeeding Routine post-op care  Neta MendsDaniela C Paul, CNM 03/14/2016, 10:30 AM

## 2016-03-14 NOTE — Anesthesia Postprocedure Evaluation (Signed)
Anesthesia Post Note  Patient: Sierra Choi  Procedure(s) Performed: Procedure(s) (LRB): CESAREAN SECTION (N/A)  Patient location during evaluation: Mother Baby Anesthesia Type: Spinal Level of consciousness: awake and alert Pain management: pain level controlled Vital Signs Assessment: post-procedure vital signs reviewed and stable Respiratory status: spontaneous breathing Cardiovascular status: stable Postop Assessment: no headache, spinal receding, patient able to bend at knees and no signs of nausea or vomiting Anesthetic complications: no Comments: Denies pain.     Last Vitals:  Filed Vitals:   03/14/16 0015 03/14/16 0115  BP: 114/67 117/65  Pulse: 101 104  Temp: 36.7 C 36.6 C  Resp: 18 18    Last Pain:  Filed Vitals:   03/14/16 0701  PainSc: 0-No pain   Pain Goal: Patients Stated Pain Goal: 7 (03/13/16 1730)               Merrilyn PumaWRINKLE,[name]

## 2016-03-14 NOTE — Lactation Note (Signed)
This note was copied from a baby's chart. Lactation Consultation Note  Assisted w/ helping latch baby in cradle, cross cradle and football positions. Mother needs lots of instruction and review.  Baby was very sleepy and did not open very wide.  Mouthed nipple but did not suck. Hand expressed 6 ml of colostrum into spoon and fed to baby. Left baby STS on mother's chest. Encouraged mother to keep baby STS to help w/ feeding cues. Instructed parents that if baby does not wake to feed- give her another spoonful of colostrum in 2hours. Report given to Care Onetephanie RN.   Patient Name: Sierra Choi EAVWU'JToday's Date: 03/14/2016 Reason for consult: Follow-up assessment   Maternal Data Has patient been taught Hand Expression?: Yes Does the patient have breastfeeding experience prior to this delivery?: No  Feeding    LATCH Score/Interventions Latch: Too sleepy or reluctant, no latch achieved, no sucking elicited. Intervention(s): Skin to skin;Teach feeding cues;Waking techniques Intervention(s): Adjust position;Assist with latch;Breast massage  Audible Swallowing: None Intervention(s): Skin to skin;Hand expression  Type of Nipple: Everted at rest and after stimulation  Comfort (Breast/Nipple): Soft / non-tender     Hold (Positioning): Full assist, staff holds infant at breast  LATCH Score: 4  Lactation Tools Discussed/Used     Consult Status Consult Status: Follow-up Date: 03/15/16 Follow-up type: In-patient    Sierra Choi, Sierra Choi [facility] 03/14/2016, 2:33 PM

## 2016-03-15 NOTE — Progress Notes (Signed)
Subjective: POD# 2 Information for the patient's newborn:  Karger, Girl Kenora [[id number]][[id number]]  female  Reports feeling well, up in room and ambulating with ease.  Cath x 1 for urinary retention after foley removal yesterday, able to void freely since and had BM this AM. Feeding: breast and bottle Patient reports tolerating PO.  Breast symptoms: not able to latch baby on [facility] Pain controlled with Ibuprofen Denies HA/SOB/C/P/N/V/dizziness. Flatus denies. She reports vaginal bleeding as normal, without clots.  Objective:   VS:  Filed Vitals:   03/14/16 0930 03/14/16 1320 03/14/16 1800 03/15/16 0500  BP: 112/54 108/57 109/56 92/57  Pulse: 93 95 98 82  Temp: 98.2 F (36.8 C) 98.1 F (36.7 C) 98.3 F (36.8 C) 98 F (36.7 C)  TempSrc: Oral Oral  Oral  Resp: 18 18 24 20   Height:      Weight:      SpO2: 100% 100% 100%      Intake/Output Summary (Last 24 hours) at 03/15/16 0942 Last data filed at 03/14/16 1945  Gross per 24 hour  Intake      0 ml  Output   1375 ml  Net  -1375 ml        Recent Labs  03/13/16 0740 03/14/16 0639  WBC 11.2* 17.1*  HGB 12.0 9.6*  HCT 36.8 29.7*  PLT 190 173     Blood type: --/--/B POS, B POS (07/14 0740)  Rubella: Immune (12/23 0000)     Physical Exam:  General: alert, cooperative and no distress CV: Regular rate and rhythm Resp: clear Abdomen: soft, nontender, normal bowel sounds Incision: clean, dry and intact Uterine Fundus: firm, below umbilicus, nontender Lochia: minimal Ext: edema +1 pedal and pretibial      Assessment/Plan: 25 y.o.   POD# 2. [id number]                  Principal Problem:   Postpartum care following cesarean delivery (7/14) Active Problems:   Acute blood loss anemia  - start oral Fe at discharge  Doing well, stable.  Advance diet as tolerated Ambulate Continue LC support for initiating breastfeeding Routine post-op care Anticipate DC in AM  Neta Mendsaniela C Paul,  CNM 03/15/2016, 9:42 AM

## 2016-03-15 NOTE — Lactation Note (Addendum)
This note was copied from a baby's chart. Lactation Consultation Note  Baby now 8543 hours old and has finally latched on L side.  Sustained latch for more than 20 min. Encouraged mother to try on other breast when baby gets sleepy on R side. Mother earlier had pumped 20 ml of breastmilk that was bottle fed to baby. Attempted with #16&#20NS that were prefilled with alimentum. Baby sucked a few times and came off NS. Was able to latch  without NS. Sucks and some swallows observed. Mother sat on couch laid back and bent toward L side.  Pillows were placed under baby to bring her to nipple height.  It has been difficult to get mother into a good position to breastfeed - parents happy this position worked. Mother will post pump for 10-20 min.  Suggest waking baby within 1-2 hours and give her volume pumped.  Provided volume guidelines and recommend giving pumped breastmilk first and the difference with formula. Reviewed milk storage.     Mother had been pumping 6-9 ml of colostrum and giving it to baby. Baby is still sleepy.  Encouraged parents to place baby STS until she demonstrates feeding cues. Attempted latching in cross cradle and football hold on both breasts. Baby did not wake to feed. Had mother pump w/ DEBP.  Mother was able to pump 20 ml of colostrum. Attempted finger syringe feeding but baby would not suck - she keeps tongue on roof of mouth. With encouragement baby did start sucking on slow flow bottle. Suggest mother within 3 hours and call LC for assistance w/ next feeding.   Patient Name: Girl Minerva FesterSingamma Ton WUJWJ'XToday's Date: 03/15/2016     Maternal Data    Feeding Feeding Type: Breast Fed  LATCH Score/Interventions Latch: Too sleepy or reluctant, no latch achieved, no sucking elicited. Intervention(s): Skin to skin;Teach feeding cues;Waking techniques Intervention(s): Adjust position;Assist with latch;Breast massage;Breast compression  Audible Swallowing:  None Intervention(s): Skin to skin;Hand expression  Type of Nipple: Everted at rest and after stimulation  Comfort (Breast/Nipple): Filling, red/small blisters or bruises, mild/mod discomfort  Problem noted: Filling  Hold (Positioning): Assistance needed to correctly position infant at breast and maintain latch.  LATCH Score: 4  Lactation Tools Discussed/Used Pump Review: Setup, frequency, and cleaning;Milk Storage Date initiated:: 03/14/16   Consult Status      Dahlia ByesBerkelhammer, Ruth Boschen 03/15/2016, 12:21 PM

## 2016-03-16 MED ORDER — IBUPROFEN 600 MG PO TABS: 600.0000 mg | ORAL_TABLET | Freq: Four times a day (QID) | ORAL | Status: AC

## 2016-03-16 MED ORDER — OXYCODONE-ACETAMINOPHEN 5-325 MG PO TABS: 1.0000 | ORAL_TABLET | ORAL | Status: AC | PRN

## 2016-03-16 NOTE — Discharge Summary (Signed)
  POSTOPERATIVE DISCHARGE SUMMARY:  Patient ID: Sierra Choi MRN: [id number] DOB/AGE: 1991/08/09 25 y.o.  Admit date: 03/13/2016 Admission Diagnoses: 40 weeks / suspect LGA / polyhydramnios  Discharge date:  03/16/2016 Discharge Diagnoses: Failed IOL - failed engagement  Prenatal history: [id number]   EDC : 03/13/2016, by Other Basis  [facility]  Primary provider : Mody Prenatal course complicated by polyhydramnios / LGA suspected / no evidence of GDM  Prenatal Labs: ABO, Rh: --/--/B POS, B POS (07/14 0740) Antibody: NEG (07/14 0740) Rubella: Immune (12/23 0000)   RPR: Non Reactive (07/14 0740)  HBsAg: Negative (12/23 0000)  HIV: Non-reactive (12/23 0000)  GBS: Positive (06/23 0000)   Medical / Surgical History :  Past medical history:  Past Medical History  Diagnosis Date  . Monoamniotic and monochorionic twin gestation in first trimester   . Polyhydramnios   . Hirsutism   . Polyhydramnios in third trimester, antepartum 03/13/2016    Past surgical history:  Past Surgical History  Procedure Laterality Date  . Dilation and curettage of uterus    . Cesarean section N/A 03/13/2016    Procedure: CESAREAN SECTION;  Surgeon: Shea EvansVaishali Mody, MD;  [facility];  Service: Obstetrics;  Laterality: N/A;    Family History: History reviewed. No pertinent family history.  Social History:  reports that she has never smoked. She has never used smokeless tobacco. She reports that she does not drink alcohol or use illicit drugs.  Allergies: Review of patient's allergies indicates no known allergies.   Current Medications at time of admission:  Prior to Admission medications   Medication Sig Start Date End Date Taking? Authorizing Provider  IRON PO Take 1 tablet by mouth daily.   Yes Historical Provider, MD  Prenatal Vit-Fe Fumarate-FA (PRENATAL MULTIVITAMIN) TABS tablet Take 1 tablet by mouth daily at 12 noon.   Yes Historical  Provider, MD    Intrapartum Course:  Admit for induction labor with labor with failed engagement despite AROM Interventions required: cesarean section  Procedures: Cesarean section delivery on 03/13/2016 with delivery of viable newborn by Dr Juliene PinaMody   See operative report for further details APGAR (1 MIN): 10   APGAR (5 MINS):    Postoperative / postpartum course:  discharge on POD 3 Complicated by: ABL anemia compounding IDA of pregnancy  Discharge Instructions:  Discharged Condition: stable  Activity: pelvic rest and postoperative restrictions x 2   Diet: routine  Medications:    Medication List    ASK your doctor about these medications        IRON PO  Take 1 tablet by mouth daily.     prenatal multivitamin Tabs tablet  Take 1 tablet by mouth daily at 12 noon.        Wound Care: keep clean and dry / remove honeycomb POD 5 Postpartum Instructions: [facility] discharge booklet - instructions reviewed  Discharge to: Home  Follow up :  [facility] in 1-3 days for interval visit with nurse for dressing removal [facility] in 6 weeks for routine postpartum visit with Dr Juliene PinaMody                Signed: Marlinda MikeBAILEY, [name] CNM, MSN, [facility] 03/16/2016, 10:34 AM

## 2016-03-16 NOTE — Lactation Note (Signed)
This note was copied from a baby's chart. Lactation Consultation Note: Mother request assistance with latching infant. Mother has semi-flat nipples. Mother placed infant in football hold. Infant refused breast multiple times for difficult latch. Infant continues to refuse bare breast. #20 nipple shield applied to mothers with a good fit. Infant latched on and off for 10-15 mins. 5 ml ebm given with curved tip syringe.  LC and PGM fed infant with a bottle. Infant took 10 ml of ebm. Infant tongue thrust the bottle nipple. Parents taught paced bottle feeding. Advised parents to get a wide base bottle nipple. Father states that they have an app[facility] with [facility]. Mother advised to continue to offer breast before bottle. Lots of teaching on proper latch and firm support. Mother receptive to all teaching.   Patient Name: Sierra Choi ZOXWR'UToday's Date: 03/16/2016 Reason for consult: Follow-up assessment   Maternal Data    Feeding Feeding Type: Breast Fed Length of feed: 10 min (on and off using a nipple shield)  LATCH Score/Interventions Latch: Repeated attempts needed to sustain latch, nipple held in mouth throughout feeding, stimulation needed to elicit sucking reflex. Intervention(s): Adjust position;Breast massage;Breast compression  Audible Swallowing: A few with stimulation Intervention(s): Skin to skin;Hand expression  Type of Nipple: Everted at rest and after stimulation  Comfort (Breast/Nipple): Filling, red/small blisters or bruises, mild/mod discomfort     Hold (Positioning): Assistance needed to correctly position infant at breast and maintain latch. Intervention(s): Support Pillows;Position options  LATCH Score: 6  Lactation Tools Discussed/Used Tools: Nipple Shields Nipple shield size: 20   Consult Status Consult Status: Follow-up Date: 03/17/16 Follow-up type: Out-patient ([facility]  )    Sierra Choi, Sierra Choi 03/16/2016, 1:54 PM

## 2016-03-16 NOTE — Progress Notes (Signed)
POSTOPERATIVE DAY # 3 S/P CS - failed IOL / no engagement of head despite AROM   S:         Reports feeling ok             Tolerating po intake / no nausea / no vomiting / no flatus / no BM             Bleeding is light             Pain controlled with motrin and oxycodone             Up ad lib / ambulatory/ voiding QS  Newborn breast feeding  O:  VS: BP 101/63 mmHg  Pulse 81  Temp(Src) 98.3 F (36.8 C) (Oral)  Resp 16  Ht 5\' 1"  (1.549 m)  Wt 78.926 kg (174 lb)  BMI 32.89 kg/m2  SpO2 100%  Breastfeeding? Unknown   LABS:               Recent Labs  03/14/16 0639  WBC 17.1*  HGB 9.6*  PLT 173               Bloodtype: --/--/B POS, B POS (07/14 0740)  Rubella: Immune (12/23 0000)                                 Physical Exam:             Alert and Oriented X3  Lungs: Clear and unlabored  Heart: regular rate and rhythm / no mumurs  Abdomen: soft, non-tender, mildly distended active BS             Fundus: firm, non-tender, U-1             Dressing intact some dried areas              Incision:  approximated with suture / no erythema / no ecchymosis / small dried drainage  Perineum: intact  Lochia: light  Extremities: trace to 1+ pedal edema, no calf pain or tenderness, negative Homans  A:        POD # 3 S/P CS             P:        Routine postoperative care              DC home - follow-up at [facility] for dressing removal this week on Peds visit day per family preference     Sierra Choi, Sierra Choi CNM, MSN, [facility] 03/16/2016, 9:53 AM

## 2016-03-16 NOTE — Lactation Note (Addendum)
This note was copied from a baby's chart. Lactation Consultation Note: Mother's milk is coming in. She last pumped 30 ml. Infant was given 20 ml with a bottle nipple. Father reports that infant was not fed from the breast this last feeding. Advised to  attempt to latch infant every 2-3 hours with all feedings. Mother was given a Horticulturist, commercialrental folder to fill out to rent a 2 week rental.  Mother advised in good breast massage and to ice breast every 3-4 hours to prevent severe engorgement. Mother receptive to all teaching. Mother will page to have assistance with next latch. She has been using a nipple shield when she feeds infant.   Mother rented a 2 week [employer] . Infant was given another 10 ml 30 mins ago. Parents still want assistance with latching infant . [facility] will page for [facility] assistance.   Patient Name: Girl Kathi DerSingamma Pritt WUJWJ'XToday's Date: 03/16/2016 Reason for consult: Follow-up assessment   Maternal Data    Feeding Feeding Type: Breast Milk  LATCH Score/Interventions                      Lactation Tools Discussed/Used     Consult Status Consult Status: Follow-up    Stevan BornKendrick, Neita Landrigan [facility] 03/16/2016, 10:47 AM

## 2016-03-19 ENCOUNTER — Encounter (HOSPITAL_COMMUNITY): Payer: Self-pay
